# Patient Record
Sex: Male | Born: 1965 | Race: Black or African American | Hispanic: No | Marital: Married
Health system: Southern US, Community
[De-identification: ages and names within clinical notes are randomized; demographics above are authoritative.]

---

## 2003-07-19 ENCOUNTER — Ambulatory Visit (HOSPITAL_COMMUNITY): Admission: RE | Admit: 2003-07-19 | Discharge: 2003-07-19 | Payer: Self-pay | Admitting: Family Medicine

## 2003-07-19 ENCOUNTER — Encounter: Payer: Self-pay | Admitting: Family Medicine

## 2006-12-07 ENCOUNTER — Encounter: Admission: RE | Admit: 2006-12-07 | Discharge: 2007-03-07 | Payer: Self-pay | Admitting: Family Medicine

## 2007-05-06 ENCOUNTER — Encounter (INDEPENDENT_AMBULATORY_CARE_PROVIDER_SITE_OTHER): Payer: Self-pay | Admitting: Urology

## 2007-05-06 ENCOUNTER — Ambulatory Visit (HOSPITAL_COMMUNITY): Admission: RE | Admit: 2007-05-06 | Discharge: 2007-05-06 | Payer: Self-pay | Admitting: Urology

## 2010-10-03 ENCOUNTER — Ambulatory Visit (HOSPITAL_COMMUNITY)
Admission: RE | Admit: 2010-10-03 | Discharge: 2010-10-03 | Payer: Self-pay | Source: Home / Self Care | Attending: Orthopaedic Surgery | Admitting: Orthopaedic Surgery

## 2010-12-11 ENCOUNTER — Ambulatory Visit (HOSPITAL_COMMUNITY)
Admission: RE | Admit: 2010-12-11 | Discharge: 2010-12-11 | Disposition: A | Discharge status: Home / Self Care | Payer: BC Managed Care – PPO | Source: Ambulatory Visit | Attending: Orthopaedic Surgery | Admitting: Orthopaedic Surgery

## 2010-12-11 DIAGNOSIS — M25673 Stiffness of unspecified ankle, not elsewhere classified: Secondary | ICD-10-CM | POA: Insufficient documentation

## 2010-12-11 DIAGNOSIS — M25676 Stiffness of unspecified foot, not elsewhere classified: Secondary | ICD-10-CM | POA: Insufficient documentation

## 2010-12-11 DIAGNOSIS — M25476 Effusion, unspecified foot: Secondary | ICD-10-CM | POA: Insufficient documentation

## 2010-12-11 DIAGNOSIS — M6281 Muscle weakness (generalized): Secondary | ICD-10-CM | POA: Insufficient documentation

## 2010-12-11 DIAGNOSIS — R262 Difficulty in walking, not elsewhere classified: Secondary | ICD-10-CM | POA: Insufficient documentation

## 2010-12-11 DIAGNOSIS — M25473 Effusion, unspecified ankle: Secondary | ICD-10-CM | POA: Insufficient documentation

## 2010-12-11 DIAGNOSIS — IMO0001 Reserved for inherently not codable concepts without codable children: Secondary | ICD-10-CM | POA: Insufficient documentation

## 2010-12-16 LAB — BASIC METABOLIC PANEL
BUN: 12 mg/dL (ref 6–23)
CO2: 27 mEq/L (ref 19–32)
Calcium: 9.6 mg/dL (ref 8.4–10.5)
Chloride: 104 mEq/L (ref 96–112)
Creatinine, Ser: 1.42 mg/dL (ref 0.4–1.5)
GFR calc Af Amer: >60 mL/min (ref >60)
GFR calc non Af Amer: 54 mL/min — ABNORMAL LOW (ref >60)
Glucose, Bld: 142 mg/dL — ABNORMAL HIGH (ref 70–99)
Potassium: 4.3 mEq/L (ref 3.5–5.1)
Sodium: 140 mEq/L (ref 135–145)

## 2010-12-16 LAB — CBC
HCT: 43 % (ref 39.0–52.0)
Hemoglobin: 14.6 g/dL (ref 13.0–17.0)
MCH: 31.6 pg (ref 26.0–34.0)
MCV: 93.1 fL (ref 78.0–100.0)
RBC: 4.62 MIL/uL (ref 4.22–5.81)

## 2010-12-17 ENCOUNTER — Ambulatory Visit (HOSPITAL_COMMUNITY)
Admission: RE | Admit: 2010-12-17 | Discharge: 2010-12-17 | Disposition: A | Discharge status: Home / Self Care | Payer: BC Managed Care – PPO | Source: Ambulatory Visit | Attending: *Deleted | Admitting: *Deleted

## 2010-12-19 ENCOUNTER — Ambulatory Visit (HOSPITAL_COMMUNITY)
Admission: RE | Admit: 2010-12-19 | Discharge: 2010-12-19 | Disposition: A | Discharge status: Home / Self Care | Payer: BC Managed Care – PPO | Source: Ambulatory Visit | Attending: *Deleted | Admitting: *Deleted

## 2010-12-24 ENCOUNTER — Ambulatory Visit (HOSPITAL_COMMUNITY): Payer: BC Managed Care – PPO | Admitting: Physical Therapy

## 2010-12-26 ENCOUNTER — Ambulatory Visit (HOSPITAL_COMMUNITY)
Admission: RE | Admit: 2010-12-26 | Discharge: 2010-12-26 | Disposition: A | Discharge status: Home / Self Care | Payer: BC Managed Care – PPO | Source: Ambulatory Visit | Attending: *Deleted | Admitting: *Deleted

## 2011-02-18 NOTE — Op Note (Signed)
NAME:  Marcus Parker, Marcus Parker                ACCOUNT NO.:  [id number]   MEDICAL RECORD NO.:  [id number]          PATIENT TYPE:  AMB   LOCATION:  DAY                           FACILITY:  [facility]   PHYSICIAN:  Dennie Maizes, M.D.   DATE OF BIRTH:  04-07-1966   DATE OF PROCEDURE:  05/06/2007  DATE OF DISCHARGE:                               OPERATIVE REPORT   PREOPERATIVE DIAGNOSIS:  Recurrent balanitis.   POSTOPERATIVE DIAGNOSIS:  Recurrent balanitis.   OPERATIVE PROCEDURE:  Circumcision.   ANESTHESIA:  General.   SURGEON:  Dennie Maizes, M.D.   COMPLICATIONS:  None.   ESTIMATED BLOOD LOSS:  Minimal.   SPECIMEN:  Foreskin which was sent to pathology.   INDICATIONS FOR PROCEDURE:  This 45 year old male with recurrent  balanitis was taken to the operating room today for circumcision.   DESCRIPTION OF PROCEDURE:  General anesthesia was induced and the  patient was placed on the OR table in the supine position.  The lower  abdomen and genitalia were prepped and draped in a sterile fashion.  The  foreskin was clamped at the 6 and 12 o'clock positions with straight  hemostats.  Dorsal and ventral slits were made and two lateral skin  flaps were raised.  The redundant foreskin was then excised.  Hemostasis  was obtained with cauterization.  The edges of foreskin were then  approximated using 4-0 chromic gut.  Vaseline gauze dressing and Coban  are applied to the penis.  The estimated blood loss was minimal.  The  patient was transferred to the PACU in a satisfactory condition.      Dennie Maizes, M.D.  Electronically Signed     SK/MEDQ  D:  05/06/2007  T:  05/06/2007  Job:  [id number]

## 2011-02-18 NOTE — H&P (Signed)
NAME:  Marcus Parker, Marcus Parker                ACCOUNT NO.:  [id number]   MEDICAL RECORD NO.:  [id number]          PATIENT TYPE:  AMB   LOCATION:  DAY                           FACILITY:  [facility]   PHYSICIAN:  Dennie Maizes, M.D.   DATE OF BIRTH:  09/27/1966   DATE OF ADMISSION:  05/06/2007  DATE OF DISCHARGE:  [facility]                              HISTORY & PHYSICAL   CHIEF COMPLAINT:  Recurrent inflammations to the foreskin.   HISTORY OF PRESENT ILLNESS:  This 45 year old male complains of  recurrent inflammation of the foreskin for a few months, noticed  irritation of the foreskin and he has pain during sexual intercourse.  He is unable to retract the foreskin fully.  There are no voiding  symptoms.  He is voiding in a good stream.  There is no history of a  urinary tract infection or hematuria.   PAST MEDICAL HISTORY:  No medical illnesses, history of testicular  torsion, status post bilateral scrotal orchiopexy in 1997.   MEDICATIONS:  None.   ALLERGIES:  None.   FAMILY HISTORY:  The family history is positive for diabetes mellitus.   PHYSICAL EXAMINATION:  HEENT:  Normal.  LUNGS:  The lungs are clear to auscultation.  HEART:  Regular rate and rhythm, no murmurs.  ABDOMEN:  The abdomen is soft, no palpable masses, bladder not palpable.  GENITALIA:  The penis shows that there is thickening and inflammation of  the foreskin as well as phimosis.  The right testes is small in size and  the left testes is of normal size.   IMPRESSION:  Recurrent balanitis, phimosis.   PLAN:  Circumcision, under anesthesia in short stay center.  I have  discussed with the patient regarding the diagnosis, operative details of  no treatments, outcomes, possible risks and complications, and he has  agreed for the procedure to be done.      Dennie Maizes, M.D.  Electronically Signed     SK/MEDQ  D:  05/05/2007  T:  05/06/2007  Job:  [id number]   cc:   Dennie Maizes, M.D.  Fax: [phone]

## 2011-05-04 IMAGING — RF DG ANKLE 2V *L*
1 series · 2 of 2 positions shown · non-contrast
Comparison: None.

CLINICAL DATA: 44-year-old male undergoing ORIF left fibula.

LEFT ANKLE - 2 VIEW

[Series 1: run · 2 of 2 slices shown]
[im 1/2]
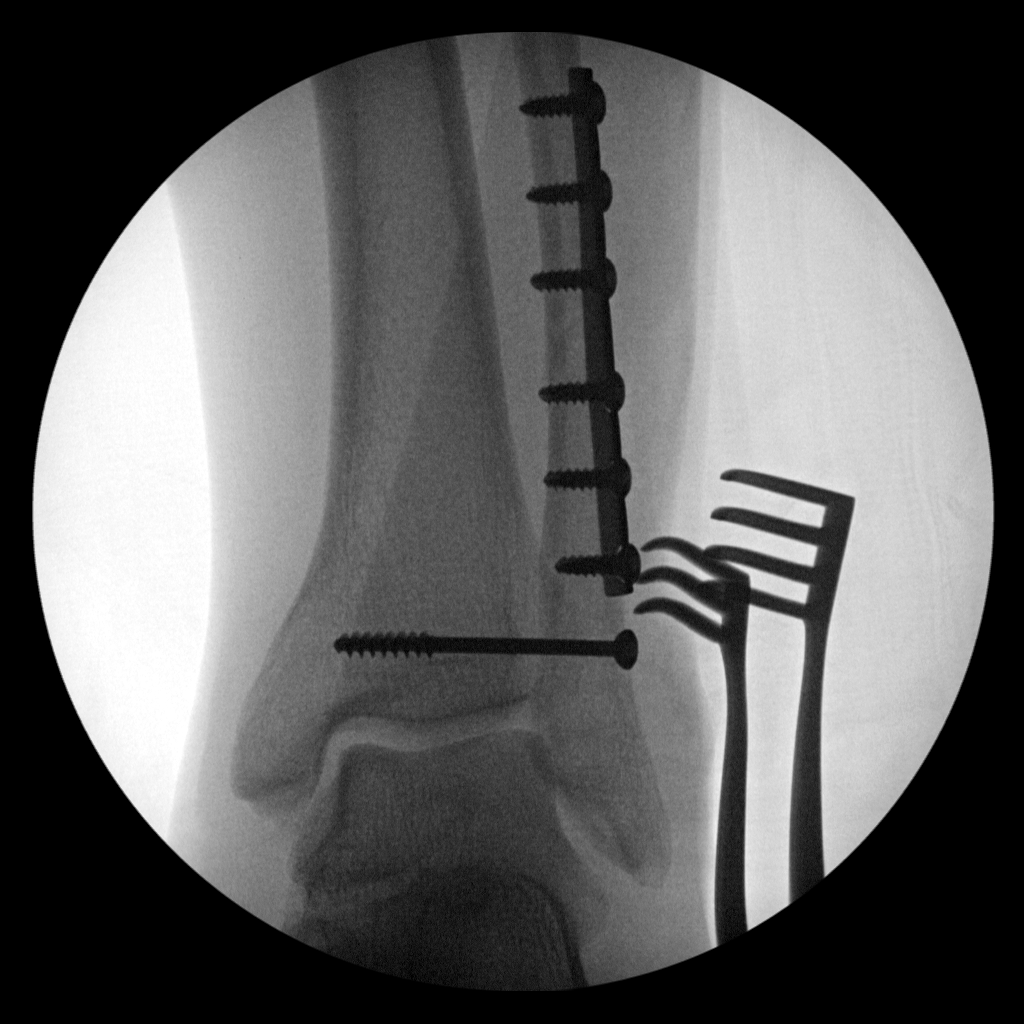
[im 2/2]
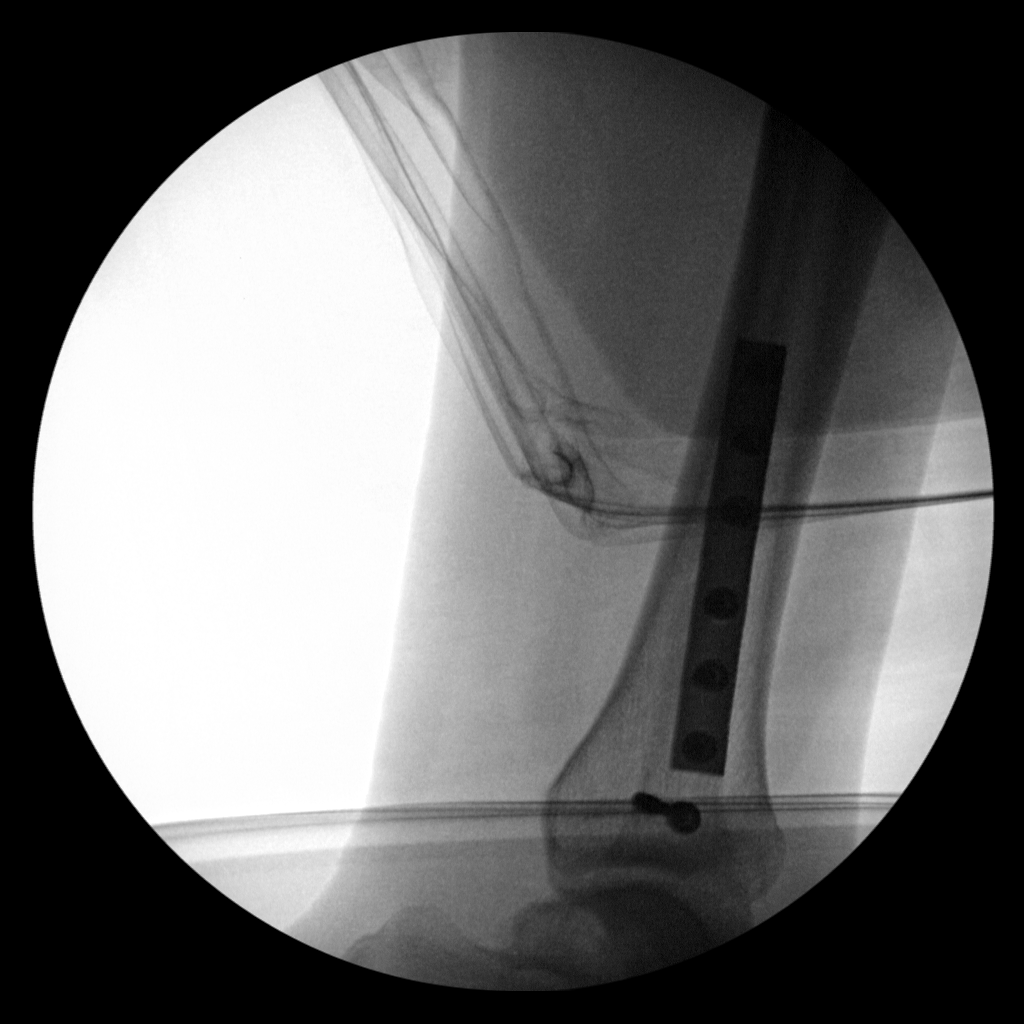

[2 of 2 positions shown; findings below may reference images not displayed]

FINDINGS: Two intraoperative fluoroscopic spot views about the left
ankle.  Lateral fibula screw and plate fixator placed.  A single
distal fibula screw traverses the tibiofibular syndesmosis.
Alignment is near anatomic.
IMPRESSION: ORIF distal left fibula and tibiofibular syndesmosis without
adverse features.

## 2011-07-21 LAB — CBC
HCT: 43.1
Hemoglobin: 14.6
MCHC: 33.8
RBC: 4.67
RDW: 13

## 2011-07-21 LAB — BASIC METABOLIC PANEL
CO2: 27
Chloride: 106
Glucose, Bld: 86
Potassium: 4.3
Sodium: 140

## 2020-01-14 ENCOUNTER — Ambulatory Visit: Payer: Self-pay | Attending: Internal Medicine

## 2020-01-14 DIAGNOSIS — Z23 Encounter for immunization: Secondary | ICD-10-CM

## 2020-01-14 NOTE — Progress Notes (Signed)
   Covid-19 Vaccination Clinic  Name:  Marcus Parker    MRN: [id number] DOB: 1965-12-18  01/14/2020  Marcus Parker was observed post Covid-19 immunization for 15 minutes without incident. He was provided with Vaccine Information Sheet and instruction to access the V-Safe system.   Marcus Parker was instructed to call 911 with any severe reactions post vaccine: Marland Kitchen Difficulty breathing  . Swelling of face and throat  . A fast heartbeat  . A bad rash all over body  . Dizziness and weakness   Immunizations Administered    Name Date Dose VIS Date Route   Pfizer COVID-19 Vaccine 01/14/2020 10:37 AM 0.3 mL 09/16/2019 Intramuscular   Manufacturer: [employer], [employer]   Lot: [id number]   NDC: [address]-6

## 2020-01-21 ENCOUNTER — Ambulatory Visit: Payer: Self-pay

## 2020-01-31 ENCOUNTER — Ambulatory Visit: Payer: Self-pay

## 2020-02-08 ENCOUNTER — Ambulatory Visit: Payer: Self-pay | Attending: Internal Medicine

## 2020-02-08 DIAGNOSIS — Z23 Encounter for immunization: Secondary | ICD-10-CM

## 2020-02-08 NOTE — Progress Notes (Signed)
   Covid-19 Vaccination Clinic  Name:  Marcus Parker    MRN: [id number] DOB: Feb 09, 1966  02/08/2020  [name] was observed post Covid-19 immunization for 15 minutes without incident. He was provided with Vaccine Information Sheet and instruction to access the V-Safe system.   [name] was instructed to call 911 with any severe reactions post vaccine: Marland Kitchen Difficulty breathing  . Swelling of face and throat  . A fast heartbeat  . A bad rash all over body  . Dizziness and weakness   Immunizations Administered    Name Date Dose VIS Date Route   Pfizer COVID-19 Vaccine 02/08/2020  3:59 PM 0.3 mL 11/30/2018 Intramuscular   Manufacturer: [employer], [employer]   Lot: [id number]   NDC: [address]-7
